# Patient Record
Sex: Male | Born: 1960
Health system: Southern US, Community
[De-identification: ages and names within clinical notes are randomized; demographics above are authoritative.]

## PROBLEM LIST (undated history)

## (undated) DIAGNOSIS — R569 Unspecified convulsions: Secondary | ICD-10-CM

## (undated) DIAGNOSIS — I10 Essential (primary) hypertension: Secondary | ICD-10-CM

## (undated) DIAGNOSIS — F419 Anxiety disorder, unspecified: Secondary | ICD-10-CM

## (undated) DIAGNOSIS — K759 Inflammatory liver disease, unspecified: Secondary | ICD-10-CM

## (undated) HISTORY — PX: KNEE SURGERY: SHX244

## (undated) HISTORY — PX: HERNIA REPAIR: SHX51

## (undated) HISTORY — PX: SHOULDER ARTHROSCOPY: SHX128

## (undated) HISTORY — PX: BACK SURGERY: SHX140

## (undated) HISTORY — PX: ANKLE SURGERY: SHX546

---

## 2012-01-01 ENCOUNTER — Ambulatory Visit: Payer: Self-pay | Admitting: Physician Assistant

## 2014-01-15 DIAGNOSIS — I4891 Unspecified atrial fibrillation: Secondary | ICD-10-CM | POA: Diagnosis not present

## 2014-01-15 DIAGNOSIS — M549 Dorsalgia, unspecified: Secondary | ICD-10-CM | POA: Diagnosis not present

## 2014-01-15 DIAGNOSIS — R55 Syncope and collapse: Secondary | ICD-10-CM | POA: Diagnosis not present

## 2014-01-15 DIAGNOSIS — F17200 Nicotine dependence, unspecified, uncomplicated: Secondary | ICD-10-CM | POA: Diagnosis not present

## 2014-05-02 DIAGNOSIS — M4802 Spinal stenosis, cervical region: Secondary | ICD-10-CM | POA: Diagnosis not present

## 2014-05-02 DIAGNOSIS — M5412 Radiculopathy, cervical region: Secondary | ICD-10-CM | POA: Diagnosis not present

## 2014-05-02 DIAGNOSIS — M961 Postlaminectomy syndrome, not elsewhere classified: Secondary | ICD-10-CM | POA: Diagnosis not present

## 2014-07-09 DIAGNOSIS — Z981 Arthrodesis status: Secondary | ICD-10-CM | POA: Diagnosis not present

## 2014-07-09 DIAGNOSIS — M5412 Radiculopathy, cervical region: Secondary | ICD-10-CM | POA: Diagnosis not present

## 2014-07-09 DIAGNOSIS — M4802 Spinal stenosis, cervical region: Secondary | ICD-10-CM | POA: Diagnosis not present

## 2014-07-09 DIAGNOSIS — M5012 Cervical disc disorder with radiculopathy, mid-cervical region: Secondary | ICD-10-CM | POA: Diagnosis not present

## 2014-07-09 DIAGNOSIS — R200 Anesthesia of skin: Secondary | ICD-10-CM | POA: Diagnosis not present

## 2014-07-09 DIAGNOSIS — M961 Postlaminectomy syndrome, not elsewhere classified: Secondary | ICD-10-CM | POA: Diagnosis not present

## 2014-07-11 DIAGNOSIS — M5412 Radiculopathy, cervical region: Secondary | ICD-10-CM | POA: Diagnosis not present

## 2014-07-11 DIAGNOSIS — M961 Postlaminectomy syndrome, not elsewhere classified: Secondary | ICD-10-CM | POA: Diagnosis not present

## 2014-07-16 DIAGNOSIS — M4802 Spinal stenosis, cervical region: Secondary | ICD-10-CM | POA: Diagnosis not present

## 2014-07-19 DIAGNOSIS — F17210 Nicotine dependence, cigarettes, uncomplicated: Secondary | ICD-10-CM | POA: Diagnosis not present

## 2014-07-19 DIAGNOSIS — M79601 Pain in right arm: Secondary | ICD-10-CM | POA: Diagnosis not present

## 2014-07-19 DIAGNOSIS — M4312 Spondylolisthesis, cervical region: Secondary | ICD-10-CM | POA: Diagnosis not present

## 2014-07-19 DIAGNOSIS — G40909 Epilepsy, unspecified, not intractable, without status epilepticus: Secondary | ICD-10-CM | POA: Diagnosis not present

## 2014-07-19 DIAGNOSIS — M2578 Osteophyte, vertebrae: Secondary | ICD-10-CM | POA: Diagnosis not present

## 2014-07-19 DIAGNOSIS — G8929 Other chronic pain: Secondary | ICD-10-CM | POA: Diagnosis not present

## 2014-07-19 DIAGNOSIS — M542 Cervicalgia: Secondary | ICD-10-CM | POA: Diagnosis not present

## 2014-07-19 DIAGNOSIS — R200 Anesthesia of skin: Secondary | ICD-10-CM | POA: Diagnosis not present

## 2014-07-19 DIAGNOSIS — R51 Headache: Secondary | ICD-10-CM | POA: Diagnosis not present

## 2014-07-19 DIAGNOSIS — M1990 Unspecified osteoarthritis, unspecified site: Secondary | ICD-10-CM | POA: Diagnosis not present

## 2014-07-19 DIAGNOSIS — M9971 Connective tissue and disc stenosis of intervertebral foramina of cervical region: Secondary | ICD-10-CM | POA: Diagnosis not present

## 2014-07-29 DIAGNOSIS — M5412 Radiculopathy, cervical region: Secondary | ICD-10-CM | POA: Diagnosis not present

## 2014-07-29 DIAGNOSIS — M4802 Spinal stenosis, cervical region: Secondary | ICD-10-CM | POA: Diagnosis not present

## 2014-07-29 DIAGNOSIS — M961 Postlaminectomy syndrome, not elsewhere classified: Secondary | ICD-10-CM | POA: Diagnosis not present

## 2014-08-14 DIAGNOSIS — G959 Disease of spinal cord, unspecified: Secondary | ICD-10-CM | POA: Diagnosis not present

## 2014-08-19 DIAGNOSIS — G253 Myoclonus: Secondary | ICD-10-CM | POA: Diagnosis not present

## 2014-08-19 DIAGNOSIS — M4802 Spinal stenosis, cervical region: Secondary | ICD-10-CM | POA: Diagnosis not present

## 2014-10-25 DIAGNOSIS — G959 Disease of spinal cord, unspecified: Secondary | ICD-10-CM | POA: Diagnosis not present

## 2014-10-25 DIAGNOSIS — Z01818 Encounter for other preprocedural examination: Secondary | ICD-10-CM | POA: Diagnosis not present

## 2014-11-01 DIAGNOSIS — M2578 Osteophyte, vertebrae: Secondary | ICD-10-CM | POA: Diagnosis not present

## 2014-11-01 DIAGNOSIS — G8929 Other chronic pain: Secondary | ICD-10-CM | POA: Diagnosis not present

## 2014-11-01 DIAGNOSIS — M79661 Pain in right lower leg: Secondary | ICD-10-CM | POA: Diagnosis not present

## 2014-11-01 DIAGNOSIS — F17210 Nicotine dependence, cigarettes, uncomplicated: Secondary | ICD-10-CM | POA: Diagnosis not present

## 2014-11-01 DIAGNOSIS — M47819 Spondylosis without myelopathy or radiculopathy, site unspecified: Secondary | ICD-10-CM | POA: Diagnosis not present

## 2014-11-01 DIAGNOSIS — M47816 Spondylosis without myelopathy or radiculopathy, lumbar region: Secondary | ICD-10-CM | POA: Diagnosis not present

## 2014-11-01 DIAGNOSIS — M5431 Sciatica, right side: Secondary | ICD-10-CM | POA: Diagnosis not present

## 2014-11-01 DIAGNOSIS — M4802 Spinal stenosis, cervical region: Secondary | ICD-10-CM | POA: Diagnosis not present

## 2014-11-01 DIAGNOSIS — M25551 Pain in right hip: Secondary | ICD-10-CM | POA: Diagnosis not present

## 2014-11-01 DIAGNOSIS — G40909 Epilepsy, unspecified, not intractable, without status epilepticus: Secondary | ICD-10-CM | POA: Diagnosis not present

## 2014-11-01 DIAGNOSIS — I4891 Unspecified atrial fibrillation: Secondary | ICD-10-CM | POA: Diagnosis not present

## 2014-11-01 DIAGNOSIS — M1611 Unilateral primary osteoarthritis, right hip: Secondary | ICD-10-CM | POA: Diagnosis not present

## 2014-11-01 DIAGNOSIS — M5441 Lumbago with sciatica, right side: Secondary | ICD-10-CM | POA: Diagnosis not present

## 2014-11-04 DIAGNOSIS — K4090 Unilateral inguinal hernia, without obstruction or gangrene, not specified as recurrent: Secondary | ICD-10-CM | POA: Diagnosis present

## 2014-11-04 DIAGNOSIS — F40240 Claustrophobia: Secondary | ICD-10-CM | POA: Diagnosis present

## 2014-11-04 DIAGNOSIS — G253 Myoclonus: Secondary | ICD-10-CM | POA: Diagnosis not present

## 2014-11-04 DIAGNOSIS — F419 Anxiety disorder, unspecified: Secondary | ICD-10-CM | POA: Diagnosis present

## 2014-11-04 DIAGNOSIS — M961 Postlaminectomy syndrome, not elsewhere classified: Secondary | ICD-10-CM | POA: Diagnosis present

## 2014-11-04 DIAGNOSIS — R561 Post traumatic seizures: Secondary | ICD-10-CM | POA: Diagnosis present

## 2014-11-04 DIAGNOSIS — M5412 Radiculopathy, cervical region: Secondary | ICD-10-CM | POA: Diagnosis not present

## 2015-01-08 DIAGNOSIS — G959 Disease of spinal cord, unspecified: Secondary | ICD-10-CM | POA: Diagnosis not present

## 2015-01-14 DIAGNOSIS — Z6827 Body mass index (BMI) 27.0-27.9, adult: Secondary | ICD-10-CM | POA: Diagnosis not present

## 2015-01-14 DIAGNOSIS — J019 Acute sinusitis, unspecified: Secondary | ICD-10-CM | POA: Diagnosis not present

## 2015-03-12 DIAGNOSIS — M25559 Pain in unspecified hip: Secondary | ICD-10-CM | POA: Diagnosis not present

## 2015-03-12 DIAGNOSIS — G8929 Other chronic pain: Secondary | ICD-10-CM | POA: Diagnosis not present

## 2015-04-09 DIAGNOSIS — M5412 Radiculopathy, cervical region: Secondary | ICD-10-CM | POA: Diagnosis not present

## 2015-05-30 DIAGNOSIS — J342 Deviated nasal septum: Secondary | ICD-10-CM | POA: Diagnosis not present

## 2015-05-30 DIAGNOSIS — R0981 Nasal congestion: Secondary | ICD-10-CM | POA: Diagnosis not present

## 2015-05-30 DIAGNOSIS — J329 Chronic sinusitis, unspecified: Secondary | ICD-10-CM | POA: Diagnosis not present

## 2015-06-02 DIAGNOSIS — Z23 Encounter for immunization: Secondary | ICD-10-CM | POA: Diagnosis not present

## 2015-06-10 DIAGNOSIS — J322 Chronic ethmoidal sinusitis: Secondary | ICD-10-CM | POA: Diagnosis not present

## 2015-06-10 DIAGNOSIS — R51 Headache: Secondary | ICD-10-CM | POA: Diagnosis not present

## 2015-06-10 DIAGNOSIS — J329 Chronic sinusitis, unspecified: Secondary | ICD-10-CM | POA: Diagnosis not present

## 2015-07-04 DIAGNOSIS — J320 Chronic maxillary sinusitis: Secondary | ICD-10-CM | POA: Diagnosis not present

## 2015-07-04 DIAGNOSIS — J322 Chronic ethmoidal sinusitis: Secondary | ICD-10-CM | POA: Diagnosis not present

## 2015-07-04 DIAGNOSIS — J329 Chronic sinusitis, unspecified: Secondary | ICD-10-CM | POA: Diagnosis not present

## 2015-07-04 DIAGNOSIS — R0981 Nasal congestion: Secondary | ICD-10-CM | POA: Diagnosis not present

## 2015-07-04 DIAGNOSIS — J342 Deviated nasal septum: Secondary | ICD-10-CM | POA: Diagnosis not present

## 2015-07-04 DIAGNOSIS — J321 Chronic frontal sinusitis: Secondary | ICD-10-CM | POA: Diagnosis not present

## 2015-07-04 DIAGNOSIS — J328 Other chronic sinusitis: Secondary | ICD-10-CM | POA: Diagnosis not present

## 2015-07-17 DIAGNOSIS — G629 Polyneuropathy, unspecified: Secondary | ICD-10-CM | POA: Diagnosis not present

## 2015-07-17 DIAGNOSIS — Z79899 Other long term (current) drug therapy: Secondary | ICD-10-CM | POA: Diagnosis not present

## 2015-07-17 DIAGNOSIS — M961 Postlaminectomy syndrome, not elsewhere classified: Secondary | ICD-10-CM | POA: Diagnosis not present

## 2015-08-13 DIAGNOSIS — M47812 Spondylosis without myelopathy or radiculopathy, cervical region: Secondary | ICD-10-CM | POA: Diagnosis not present

## 2015-08-13 DIAGNOSIS — M5412 Radiculopathy, cervical region: Secondary | ICD-10-CM | POA: Diagnosis not present

## 2015-08-18 DIAGNOSIS — M25552 Pain in left hip: Secondary | ICD-10-CM | POA: Diagnosis not present

## 2015-08-18 DIAGNOSIS — G8929 Other chronic pain: Secondary | ICD-10-CM | POA: Diagnosis not present

## 2015-08-18 DIAGNOSIS — M25551 Pain in right hip: Secondary | ICD-10-CM | POA: Diagnosis not present

## 2015-08-18 DIAGNOSIS — Z79899 Other long term (current) drug therapy: Secondary | ICD-10-CM | POA: Diagnosis not present

## 2015-08-18 DIAGNOSIS — M25559 Pain in unspecified hip: Secondary | ICD-10-CM | POA: Diagnosis not present

## 2015-08-19 DIAGNOSIS — J343 Hypertrophy of nasal turbinates: Secondary | ICD-10-CM | POA: Diagnosis not present

## 2015-08-19 DIAGNOSIS — J322 Chronic ethmoidal sinusitis: Secondary | ICD-10-CM | POA: Diagnosis not present

## 2015-08-19 DIAGNOSIS — J321 Chronic frontal sinusitis: Secondary | ICD-10-CM | POA: Diagnosis not present

## 2015-08-19 DIAGNOSIS — J320 Chronic maxillary sinusitis: Secondary | ICD-10-CM | POA: Diagnosis not present

## 2015-08-19 DIAGNOSIS — J329 Chronic sinusitis, unspecified: Secondary | ICD-10-CM | POA: Diagnosis not present

## 2015-08-19 DIAGNOSIS — R0981 Nasal congestion: Secondary | ICD-10-CM | POA: Diagnosis not present

## 2015-08-19 DIAGNOSIS — J342 Deviated nasal septum: Secondary | ICD-10-CM | POA: Diagnosis not present

## 2015-08-26 DIAGNOSIS — Z6825 Body mass index (BMI) 25.0-25.9, adult: Secondary | ICD-10-CM | POA: Diagnosis not present

## 2015-08-26 DIAGNOSIS — J329 Chronic sinusitis, unspecified: Secondary | ICD-10-CM | POA: Diagnosis not present

## 2015-08-26 DIAGNOSIS — J343 Hypertrophy of nasal turbinates: Secondary | ICD-10-CM | POA: Diagnosis not present

## 2015-08-26 DIAGNOSIS — Z79899 Other long term (current) drug therapy: Secondary | ICD-10-CM | POA: Diagnosis not present

## 2015-08-26 DIAGNOSIS — R748 Abnormal levels of other serum enzymes: Secondary | ICD-10-CM | POA: Diagnosis not present

## 2015-08-26 DIAGNOSIS — Z1389 Encounter for screening for other disorder: Secondary | ICD-10-CM | POA: Diagnosis not present

## 2015-08-26 DIAGNOSIS — Z8679 Personal history of other diseases of the circulatory system: Secondary | ICD-10-CM | POA: Diagnosis not present

## 2015-09-10 DIAGNOSIS — R1011 Right upper quadrant pain: Secondary | ICD-10-CM | POA: Diagnosis not present

## 2015-09-10 DIAGNOSIS — R17 Unspecified jaundice: Secondary | ICD-10-CM | POA: Diagnosis not present

## 2015-09-30 DIAGNOSIS — F17200 Nicotine dependence, unspecified, uncomplicated: Secondary | ICD-10-CM | POA: Diagnosis not present

## 2015-09-30 DIAGNOSIS — J329 Chronic sinusitis, unspecified: Secondary | ICD-10-CM | POA: Diagnosis not present

## 2015-09-30 DIAGNOSIS — R748 Abnormal levels of other serum enzymes: Secondary | ICD-10-CM | POA: Diagnosis not present

## 2015-09-30 DIAGNOSIS — R1013 Epigastric pain: Secondary | ICD-10-CM | POA: Diagnosis not present

## 2015-09-30 DIAGNOSIS — Z1389 Encounter for screening for other disorder: Secondary | ICD-10-CM | POA: Diagnosis not present

## 2015-09-30 DIAGNOSIS — Z720 Tobacco use: Secondary | ICD-10-CM | POA: Diagnosis not present

## 2015-10-02 DIAGNOSIS — B182 Chronic viral hepatitis C: Secondary | ICD-10-CM | POA: Diagnosis not present

## 2015-10-02 DIAGNOSIS — Z6826 Body mass index (BMI) 26.0-26.9, adult: Secondary | ICD-10-CM | POA: Diagnosis not present

## 2016-02-03 DIAGNOSIS — H547 Unspecified visual loss: Secondary | ICD-10-CM | POA: Diagnosis not present

## 2016-02-03 DIAGNOSIS — J069 Acute upper respiratory infection, unspecified: Secondary | ICD-10-CM | POA: Diagnosis not present

## 2016-02-03 DIAGNOSIS — B182 Chronic viral hepatitis C: Secondary | ICD-10-CM | POA: Diagnosis not present

## 2016-02-03 DIAGNOSIS — K5903 Drug induced constipation: Secondary | ICD-10-CM | POA: Diagnosis not present

## 2016-02-16 DIAGNOSIS — M5412 Radiculopathy, cervical region: Secondary | ICD-10-CM | POA: Diagnosis not present

## 2016-02-16 DIAGNOSIS — M961 Postlaminectomy syndrome, not elsewhere classified: Secondary | ICD-10-CM | POA: Diagnosis not present

## 2016-02-19 DIAGNOSIS — M25551 Pain in right hip: Secondary | ICD-10-CM | POA: Diagnosis not present

## 2016-02-19 DIAGNOSIS — G8929 Other chronic pain: Secondary | ICD-10-CM | POA: Diagnosis not present

## 2016-02-19 DIAGNOSIS — M4802 Spinal stenosis, cervical region: Secondary | ICD-10-CM | POA: Diagnosis not present

## 2016-02-19 DIAGNOSIS — M5412 Radiculopathy, cervical region: Secondary | ICD-10-CM | POA: Diagnosis not present

## 2016-02-19 DIAGNOSIS — Z79899 Other long term (current) drug therapy: Secondary | ICD-10-CM | POA: Diagnosis not present

## 2016-02-19 DIAGNOSIS — F17210 Nicotine dependence, cigarettes, uncomplicated: Secondary | ICD-10-CM | POA: Diagnosis not present

## 2016-02-19 DIAGNOSIS — M961 Postlaminectomy syndrome, not elsewhere classified: Secondary | ICD-10-CM | POA: Diagnosis not present

## 2016-02-19 DIAGNOSIS — G629 Polyneuropathy, unspecified: Secondary | ICD-10-CM | POA: Diagnosis not present

## 2016-02-19 DIAGNOSIS — M25552 Pain in left hip: Secondary | ICD-10-CM | POA: Diagnosis not present

## 2016-03-09 DIAGNOSIS — G8929 Other chronic pain: Secondary | ICD-10-CM | POA: Diagnosis not present

## 2016-03-09 DIAGNOSIS — M25551 Pain in right hip: Secondary | ICD-10-CM | POA: Diagnosis not present

## 2016-03-09 DIAGNOSIS — M25552 Pain in left hip: Secondary | ICD-10-CM | POA: Diagnosis not present

## 2016-05-05 DIAGNOSIS — J209 Acute bronchitis, unspecified: Secondary | ICD-10-CM | POA: Diagnosis not present

## 2016-05-05 DIAGNOSIS — J329 Chronic sinusitis, unspecified: Secondary | ICD-10-CM | POA: Diagnosis not present

## 2016-05-05 DIAGNOSIS — N529 Male erectile dysfunction, unspecified: Secondary | ICD-10-CM | POA: Diagnosis not present

## 2016-05-27 DIAGNOSIS — M25551 Pain in right hip: Secondary | ICD-10-CM | POA: Diagnosis not present

## 2016-05-27 DIAGNOSIS — G8929 Other chronic pain: Secondary | ICD-10-CM | POA: Diagnosis not present

## 2016-05-27 DIAGNOSIS — M25552 Pain in left hip: Secondary | ICD-10-CM | POA: Diagnosis not present

## 2016-05-27 DIAGNOSIS — M5412 Radiculopathy, cervical region: Secondary | ICD-10-CM | POA: Diagnosis not present

## 2016-08-17 DIAGNOSIS — I4891 Unspecified atrial fibrillation: Secondary | ICD-10-CM | POA: Diagnosis not present

## 2016-08-17 DIAGNOSIS — B182 Chronic viral hepatitis C: Secondary | ICD-10-CM | POA: Diagnosis not present

## 2016-08-17 DIAGNOSIS — R768 Other specified abnormal immunological findings in serum: Secondary | ICD-10-CM | POA: Diagnosis not present

## 2016-08-17 DIAGNOSIS — F17210 Nicotine dependence, cigarettes, uncomplicated: Secondary | ICD-10-CM | POA: Diagnosis not present

## 2016-08-17 DIAGNOSIS — Z1159 Encounter for screening for other viral diseases: Secondary | ICD-10-CM | POA: Diagnosis not present

## 2016-08-19 DIAGNOSIS — M25552 Pain in left hip: Secondary | ICD-10-CM | POA: Diagnosis not present

## 2016-08-19 DIAGNOSIS — M25551 Pain in right hip: Secondary | ICD-10-CM | POA: Diagnosis not present

## 2016-09-01 DIAGNOSIS — M5412 Radiculopathy, cervical region: Secondary | ICD-10-CM | POA: Diagnosis not present

## 2016-09-15 DIAGNOSIS — M542 Cervicalgia: Secondary | ICD-10-CM | POA: Diagnosis not present

## 2016-09-15 DIAGNOSIS — M5412 Radiculopathy, cervical region: Secondary | ICD-10-CM | POA: Diagnosis not present

## 2016-09-28 DIAGNOSIS — M542 Cervicalgia: Secondary | ICD-10-CM | POA: Diagnosis not present

## 2016-09-28 DIAGNOSIS — M5412 Radiculopathy, cervical region: Secondary | ICD-10-CM | POA: Diagnosis not present

## 2016-09-28 DIAGNOSIS — M4722 Other spondylosis with radiculopathy, cervical region: Secondary | ICD-10-CM | POA: Diagnosis not present

## 2016-12-21 DIAGNOSIS — B182 Chronic viral hepatitis C: Secondary | ICD-10-CM | POA: Diagnosis not present

## 2016-12-21 DIAGNOSIS — R1084 Generalized abdominal pain: Secondary | ICD-10-CM | POA: Diagnosis not present

## 2016-12-28 DIAGNOSIS — G8929 Other chronic pain: Secondary | ICD-10-CM | POA: Diagnosis not present

## 2016-12-28 DIAGNOSIS — M25552 Pain in left hip: Secondary | ICD-10-CM | POA: Diagnosis not present

## 2016-12-28 DIAGNOSIS — M25551 Pain in right hip: Secondary | ICD-10-CM | POA: Diagnosis not present

## 2017-01-07 DIAGNOSIS — M5412 Radiculopathy, cervical region: Secondary | ICD-10-CM | POA: Diagnosis not present

## 2017-02-03 DIAGNOSIS — R634 Abnormal weight loss: Secondary | ICD-10-CM | POA: Diagnosis not present

## 2017-02-03 DIAGNOSIS — Z720 Tobacco use: Secondary | ICD-10-CM | POA: Diagnosis not present

## 2017-02-03 DIAGNOSIS — Z1389 Encounter for screening for other disorder: Secondary | ICD-10-CM | POA: Diagnosis not present

## 2017-02-03 DIAGNOSIS — Z9181 History of falling: Secondary | ICD-10-CM | POA: Diagnosis not present

## 2017-02-03 DIAGNOSIS — R1084 Generalized abdominal pain: Secondary | ICD-10-CM | POA: Diagnosis not present

## 2017-02-03 DIAGNOSIS — R05 Cough: Secondary | ICD-10-CM | POA: Diagnosis not present

## 2017-02-03 DIAGNOSIS — M542 Cervicalgia: Secondary | ICD-10-CM | POA: Diagnosis not present

## 2017-02-03 DIAGNOSIS — B182 Chronic viral hepatitis C: Secondary | ICD-10-CM | POA: Diagnosis not present

## 2017-02-10 ENCOUNTER — Encounter: Payer: Self-pay | Admitting: Physician Assistant

## 2017-02-10 DIAGNOSIS — R1110 Vomiting, unspecified: Secondary | ICD-10-CM | POA: Diagnosis not present

## 2017-02-10 DIAGNOSIS — G8929 Other chronic pain: Secondary | ICD-10-CM | POA: Diagnosis not present

## 2017-02-10 DIAGNOSIS — R109 Unspecified abdominal pain: Secondary | ICD-10-CM | POA: Diagnosis not present

## 2017-02-10 DIAGNOSIS — I4891 Unspecified atrial fibrillation: Secondary | ICD-10-CM | POA: Diagnosis not present

## 2017-02-10 DIAGNOSIS — F17210 Nicotine dependence, cigarettes, uncomplicated: Secondary | ICD-10-CM | POA: Diagnosis not present

## 2017-02-15 DIAGNOSIS — R079 Chest pain, unspecified: Secondary | ICD-10-CM | POA: Diagnosis not present

## 2017-02-15 DIAGNOSIS — R05 Cough: Secondary | ICD-10-CM | POA: Diagnosis not present

## 2017-02-15 DIAGNOSIS — R634 Abnormal weight loss: Secondary | ICD-10-CM | POA: Diagnosis not present

## 2017-02-15 DIAGNOSIS — R0602 Shortness of breath: Secondary | ICD-10-CM | POA: Diagnosis not present

## 2017-02-23 DIAGNOSIS — R001 Bradycardia, unspecified: Secondary | ICD-10-CM | POA: Diagnosis not present

## 2017-02-23 DIAGNOSIS — F17200 Nicotine dependence, unspecified, uncomplicated: Secondary | ICD-10-CM | POA: Diagnosis not present

## 2017-02-23 DIAGNOSIS — R079 Chest pain, unspecified: Secondary | ICD-10-CM | POA: Diagnosis not present

## 2017-02-23 DIAGNOSIS — I480 Paroxysmal atrial fibrillation: Secondary | ICD-10-CM | POA: Diagnosis not present

## 2017-02-25 DIAGNOSIS — Z6823 Body mass index (BMI) 23.0-23.9, adult: Secondary | ICD-10-CM | POA: Diagnosis not present

## 2017-02-25 DIAGNOSIS — R109 Unspecified abdominal pain: Secondary | ICD-10-CM | POA: Diagnosis not present

## 2017-02-25 DIAGNOSIS — H579 Unspecified disorder of eye and adnexa: Secondary | ICD-10-CM | POA: Diagnosis not present

## 2017-03-01 DIAGNOSIS — B182 Chronic viral hepatitis C: Secondary | ICD-10-CM | POA: Diagnosis not present

## 2017-03-01 DIAGNOSIS — Z79891 Long term (current) use of opiate analgesic: Secondary | ICD-10-CM | POA: Diagnosis not present

## 2017-03-07 DIAGNOSIS — R079 Chest pain, unspecified: Secondary | ICD-10-CM | POA: Diagnosis not present

## 2017-03-14 DIAGNOSIS — R079 Chest pain, unspecified: Secondary | ICD-10-CM | POA: Diagnosis not present

## 2017-03-14 DIAGNOSIS — I480 Paroxysmal atrial fibrillation: Secondary | ICD-10-CM | POA: Diagnosis not present

## 2017-03-14 DIAGNOSIS — F17200 Nicotine dependence, unspecified, uncomplicated: Secondary | ICD-10-CM | POA: Diagnosis not present

## 2017-03-29 DIAGNOSIS — M25551 Pain in right hip: Secondary | ICD-10-CM | POA: Diagnosis not present

## 2017-03-29 DIAGNOSIS — G8929 Other chronic pain: Secondary | ICD-10-CM | POA: Diagnosis not present

## 2017-03-29 DIAGNOSIS — M25552 Pain in left hip: Secondary | ICD-10-CM | POA: Diagnosis not present

## 2017-04-04 DIAGNOSIS — Z5181 Encounter for therapeutic drug level monitoring: Secondary | ICD-10-CM | POA: Diagnosis not present

## 2017-04-04 DIAGNOSIS — R1084 Generalized abdominal pain: Secondary | ICD-10-CM | POA: Diagnosis not present

## 2017-04-04 DIAGNOSIS — G8929 Other chronic pain: Secondary | ICD-10-CM | POA: Diagnosis not present

## 2017-04-04 DIAGNOSIS — M25552 Pain in left hip: Secondary | ICD-10-CM | POA: Diagnosis not present

## 2017-04-04 DIAGNOSIS — K5909 Other constipation: Secondary | ICD-10-CM | POA: Diagnosis not present

## 2017-04-04 DIAGNOSIS — M25551 Pain in right hip: Secondary | ICD-10-CM | POA: Diagnosis not present

## 2017-04-04 DIAGNOSIS — R194 Change in bowel habit: Secondary | ICD-10-CM | POA: Diagnosis not present

## 2017-04-04 DIAGNOSIS — M961 Postlaminectomy syndrome, not elsewhere classified: Secondary | ICD-10-CM | POA: Diagnosis not present

## 2017-04-04 DIAGNOSIS — M5412 Radiculopathy, cervical region: Secondary | ICD-10-CM | POA: Diagnosis not present

## 2017-04-22 DIAGNOSIS — G894 Chronic pain syndrome: Secondary | ICD-10-CM | POA: Diagnosis not present

## 2017-04-26 DIAGNOSIS — R634 Abnormal weight loss: Secondary | ICD-10-CM | POA: Diagnosis not present

## 2017-04-26 DIAGNOSIS — B182 Chronic viral hepatitis C: Secondary | ICD-10-CM | POA: Diagnosis not present

## 2017-06-21 DIAGNOSIS — G8929 Other chronic pain: Secondary | ICD-10-CM | POA: Diagnosis not present

## 2017-06-21 DIAGNOSIS — M25552 Pain in left hip: Secondary | ICD-10-CM | POA: Diagnosis not present

## 2017-06-21 DIAGNOSIS — F17210 Nicotine dependence, cigarettes, uncomplicated: Secondary | ICD-10-CM | POA: Diagnosis not present

## 2017-06-21 DIAGNOSIS — B182 Chronic viral hepatitis C: Secondary | ICD-10-CM | POA: Diagnosis not present

## 2017-06-21 DIAGNOSIS — R1084 Generalized abdominal pain: Secondary | ICD-10-CM | POA: Diagnosis not present

## 2017-06-21 DIAGNOSIS — R194 Change in bowel habit: Secondary | ICD-10-CM | POA: Diagnosis not present

## 2017-06-21 DIAGNOSIS — K621 Rectal polyp: Secondary | ICD-10-CM | POA: Diagnosis not present

## 2017-06-21 DIAGNOSIS — Z79899 Other long term (current) drug therapy: Secondary | ICD-10-CM | POA: Diagnosis not present

## 2017-06-21 DIAGNOSIS — M25551 Pain in right hip: Secondary | ICD-10-CM | POA: Diagnosis not present

## 2017-06-21 DIAGNOSIS — I480 Paroxysmal atrial fibrillation: Secondary | ICD-10-CM | POA: Diagnosis not present

## 2017-06-22 DIAGNOSIS — F4321 Adjustment disorder with depressed mood: Secondary | ICD-10-CM | POA: Diagnosis not present

## 2017-06-28 DIAGNOSIS — G8929 Other chronic pain: Secondary | ICD-10-CM | POA: Diagnosis not present

## 2017-06-28 DIAGNOSIS — M25552 Pain in left hip: Secondary | ICD-10-CM | POA: Diagnosis not present

## 2017-06-28 DIAGNOSIS — M25551 Pain in right hip: Secondary | ICD-10-CM | POA: Diagnosis not present

## 2017-07-05 DIAGNOSIS — S22059A Unspecified fracture of T5-T6 vertebra, initial encounter for closed fracture: Secondary | ICD-10-CM | POA: Diagnosis not present

## 2017-07-05 DIAGNOSIS — M25512 Pain in left shoulder: Secondary | ICD-10-CM | POA: Diagnosis not present

## 2017-07-05 DIAGNOSIS — G894 Chronic pain syndrome: Secondary | ICD-10-CM | POA: Diagnosis not present

## 2017-07-05 DIAGNOSIS — I77811 Abdominal aortic ectasia: Secondary | ICD-10-CM | POA: Diagnosis not present

## 2017-07-05 DIAGNOSIS — M546 Pain in thoracic spine: Secondary | ICD-10-CM | POA: Diagnosis not present

## 2017-07-05 DIAGNOSIS — J942 Hemothorax: Secondary | ICD-10-CM | POA: Diagnosis not present

## 2017-07-05 DIAGNOSIS — S270XXA Traumatic pneumothorax, initial encounter: Secondary | ICD-10-CM | POA: Diagnosis not present

## 2017-07-05 DIAGNOSIS — S272XXA Traumatic hemopneumothorax, initial encounter: Secondary | ICD-10-CM | POA: Diagnosis not present

## 2017-07-05 DIAGNOSIS — R109 Unspecified abdominal pain: Secondary | ICD-10-CM | POA: Diagnosis not present

## 2017-07-05 DIAGNOSIS — K769 Liver disease, unspecified: Secondary | ICD-10-CM | POA: Diagnosis not present

## 2017-07-05 DIAGNOSIS — S2243XA Multiple fractures of ribs, bilateral, initial encounter for closed fracture: Secondary | ICD-10-CM | POA: Diagnosis not present

## 2017-07-05 DIAGNOSIS — I700 Atherosclerosis of aorta: Secondary | ICD-10-CM | POA: Diagnosis not present

## 2017-07-05 DIAGNOSIS — F17210 Nicotine dependence, cigarettes, uncomplicated: Secondary | ICD-10-CM | POA: Diagnosis not present

## 2017-07-05 DIAGNOSIS — M549 Dorsalgia, unspecified: Secondary | ICD-10-CM | POA: Diagnosis not present

## 2017-07-05 DIAGNOSIS — S3991XA Unspecified injury of abdomen, initial encounter: Secondary | ICD-10-CM | POA: Diagnosis not present

## 2017-07-05 DIAGNOSIS — Z043 Encounter for examination and observation following other accident: Secondary | ICD-10-CM | POA: Diagnosis not present

## 2017-07-05 DIAGNOSIS — S3992XA Unspecified injury of lower back, initial encounter: Secondary | ICD-10-CM | POA: Diagnosis not present

## 2017-07-05 DIAGNOSIS — Y33XXXA Other specified events, undetermined intent, initial encounter: Secondary | ICD-10-CM | POA: Diagnosis not present

## 2017-07-05 DIAGNOSIS — S22049A Unspecified fracture of fourth thoracic vertebra, initial encounter for closed fracture: Secondary | ICD-10-CM | POA: Diagnosis not present

## 2017-07-05 DIAGNOSIS — G8929 Other chronic pain: Secondary | ICD-10-CM | POA: Diagnosis not present

## 2017-07-05 DIAGNOSIS — S199XXA Unspecified injury of neck, initial encounter: Secondary | ICD-10-CM | POA: Diagnosis not present

## 2017-07-05 DIAGNOSIS — S24109A Unspecified injury at unspecified level of thoracic spinal cord, initial encounter: Secondary | ICD-10-CM | POA: Diagnosis not present

## 2017-07-05 DIAGNOSIS — J939 Pneumothorax, unspecified: Secondary | ICD-10-CM | POA: Diagnosis not present

## 2017-07-05 DIAGNOSIS — M542 Cervicalgia: Secondary | ICD-10-CM | POA: Diagnosis not present

## 2017-07-05 DIAGNOSIS — W1789XA Other fall from one level to another, initial encounter: Secondary | ICD-10-CM | POA: Diagnosis not present

## 2017-07-05 DIAGNOSIS — R079 Chest pain, unspecified: Secondary | ICD-10-CM | POA: Diagnosis not present

## 2017-07-05 DIAGNOSIS — W19XXXA Unspecified fall, initial encounter: Secondary | ICD-10-CM | POA: Diagnosis not present

## 2017-07-05 DIAGNOSIS — G8911 Acute pain due to trauma: Secondary | ICD-10-CM | POA: Diagnosis not present

## 2017-07-05 DIAGNOSIS — I4891 Unspecified atrial fibrillation: Secondary | ICD-10-CM | POA: Diagnosis not present

## 2017-07-05 DIAGNOSIS — J948 Other specified pleural conditions: Secondary | ICD-10-CM | POA: Diagnosis not present

## 2017-07-05 DIAGNOSIS — S27309A Unspecified injury of lung, unspecified, initial encounter: Secondary | ICD-10-CM | POA: Diagnosis not present

## 2017-07-05 DIAGNOSIS — S2242XA Multiple fractures of ribs, left side, initial encounter for closed fracture: Secondary | ICD-10-CM | POA: Diagnosis not present

## 2017-07-06 DIAGNOSIS — G8911 Acute pain due to trauma: Secondary | ICD-10-CM | POA: Diagnosis not present

## 2017-07-06 DIAGNOSIS — J942 Hemothorax: Secondary | ICD-10-CM | POA: Diagnosis not present

## 2017-07-06 DIAGNOSIS — J948 Other specified pleural conditions: Secondary | ICD-10-CM | POA: Diagnosis not present

## 2017-07-07 DIAGNOSIS — J948 Other specified pleural conditions: Secondary | ICD-10-CM | POA: Diagnosis not present

## 2017-07-07 DIAGNOSIS — G8911 Acute pain due to trauma: Secondary | ICD-10-CM | POA: Diagnosis not present

## 2017-07-07 DIAGNOSIS — J942 Hemothorax: Secondary | ICD-10-CM | POA: Diagnosis not present

## 2017-07-11 DIAGNOSIS — M25552 Pain in left hip: Secondary | ICD-10-CM | POA: Diagnosis not present

## 2017-07-11 DIAGNOSIS — Z79891 Long term (current) use of opiate analgesic: Secondary | ICD-10-CM | POA: Diagnosis not present

## 2017-07-11 DIAGNOSIS — M25551 Pain in right hip: Secondary | ICD-10-CM | POA: Diagnosis not present

## 2017-07-11 DIAGNOSIS — M961 Postlaminectomy syndrome, not elsewhere classified: Secondary | ICD-10-CM | POA: Diagnosis not present

## 2017-07-11 DIAGNOSIS — G894 Chronic pain syndrome: Secondary | ICD-10-CM | POA: Diagnosis not present

## 2017-08-11 DIAGNOSIS — M25512 Pain in left shoulder: Secondary | ICD-10-CM | POA: Diagnosis not present

## 2017-08-11 DIAGNOSIS — M25551 Pain in right hip: Secondary | ICD-10-CM | POA: Diagnosis not present

## 2017-08-11 DIAGNOSIS — G894 Chronic pain syndrome: Secondary | ICD-10-CM | POA: Diagnosis not present

## 2017-08-11 DIAGNOSIS — M25511 Pain in right shoulder: Secondary | ICD-10-CM | POA: Diagnosis not present

## 2017-08-11 DIAGNOSIS — M25552 Pain in left hip: Secondary | ICD-10-CM | POA: Diagnosis not present

## 2017-08-11 DIAGNOSIS — Z79891 Long term (current) use of opiate analgesic: Secondary | ICD-10-CM | POA: Diagnosis not present

## 2017-08-11 DIAGNOSIS — M961 Postlaminectomy syndrome, not elsewhere classified: Secondary | ICD-10-CM | POA: Diagnosis not present

## 2017-08-11 DIAGNOSIS — M25531 Pain in right wrist: Secondary | ICD-10-CM | POA: Diagnosis not present

## 2019-02-21 ENCOUNTER — Other Ambulatory Visit: Payer: Self-pay | Admitting: Neurology

## 2019-02-21 DIAGNOSIS — R253 Fasciculation: Secondary | ICD-10-CM

## 2019-03-03 ENCOUNTER — Other Ambulatory Visit: Payer: Self-pay

## 2019-03-03 ENCOUNTER — Ambulatory Visit
Admission: RE | Admit: 2019-03-03 | Discharge: 2019-03-03 | Disposition: A | Discharge status: Home / Self Care | Payer: Medicare Other | Source: Ambulatory Visit | Attending: Neurology | Admitting: Neurology

## 2019-03-03 DIAGNOSIS — R253 Fasciculation: Secondary | ICD-10-CM

## 2019-03-03 LAB — POCT I-STAT CREATININE: Creatinine, Ser: 0.8 mg/dL (ref 0.61–1.24)

## 2019-03-03 MED ORDER — GADOBUTROL 1 MMOL/ML IV SOLN
9.0000 mL | Freq: Once | INTRAVENOUS | Status: AC | PRN
  Administered 2019-03-03: 9 mL via INTRAVENOUS

## 2021-02-03 IMAGING — MR MRI HEAD WITHOUT AND WITH CONTRAST
14 series · 46 of 48 positions shown · IV contrast (Gadavist)
Comparison: CT head 06/10/2015.

CLINICAL DATA: Patient states that after neck surgery in 2446 he
developed tremors and shaking. Current symptoms of RIGHT hand
numbness.

Jerking TVH.G ([id number])
EXAM:
MRI HEAD WITHOUT AND WITH CONTRAST
TECHNIQUE: Multiplanar, multiecho pulse sequences of the brain and surrounding
structures were obtained without and with intravenous contrast.
CONTRAST:  Gadavist 9 mL.

[Series 5: ax dwi_tracew · axial · 3.0mm · 0.60mm/px · z∈[-53,+109]mm · 4 of 55 slices shown]
[im 1/55]
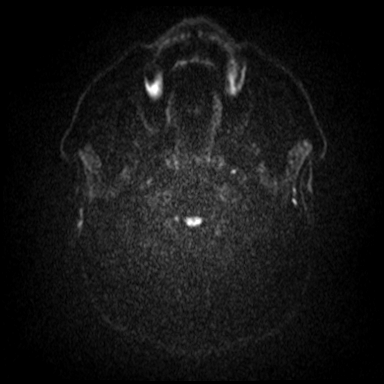
[im 19/55]
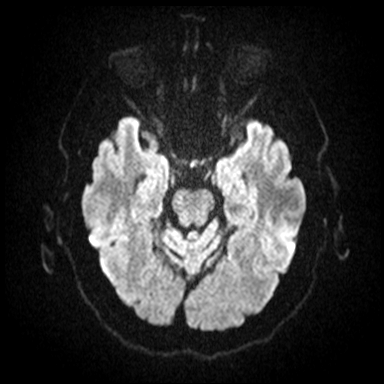
[im 37/55]
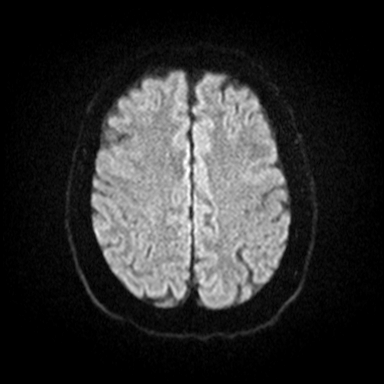
[im 55/55]
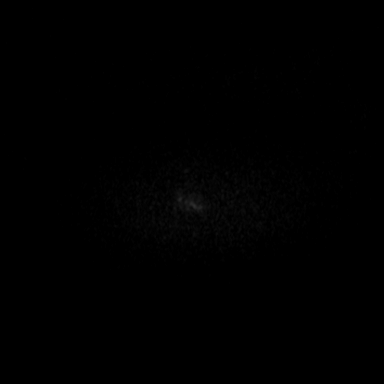

[Series 6: ax dwi_adc · axial · 3.0mm · 0.60mm/px · z∈[-53,+109]mm · 3 of 55 slices shown]
[im 1/55]
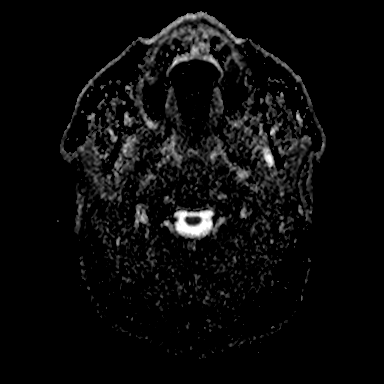
[im 28/55]
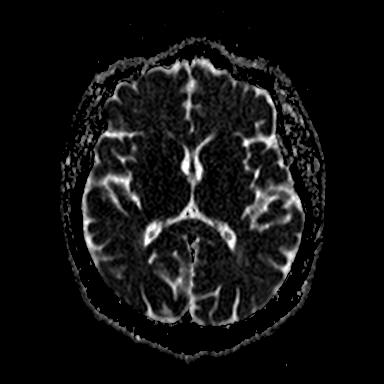
[im 55/55]
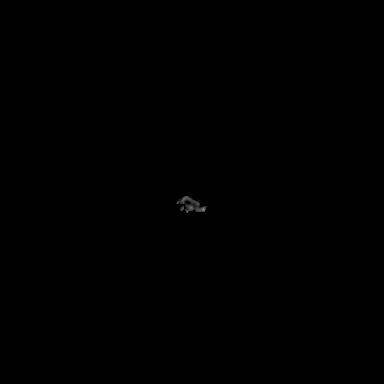

[Series 7: cor dwi_tracew · coronal · 5.0mm · 0.60mm/px · 2 of 45 slices shown (1 of 2)]
[im 1/45]
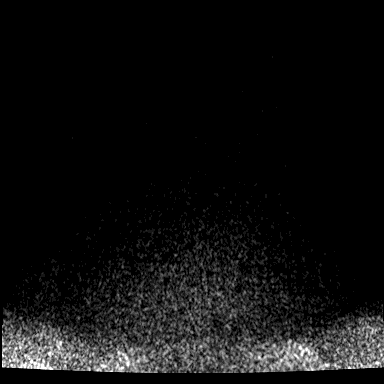
[im 45/45]
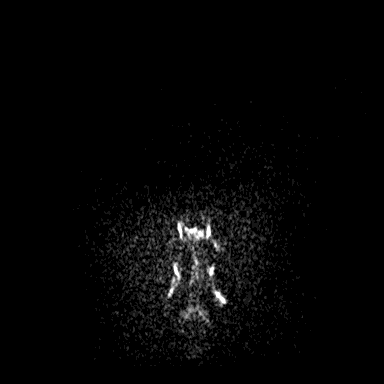

[Series 7: cor dwi_tracew · coronal · 5.0mm · 0.60mm/px · 2 of 45 slices shown (2 of 2)]
[im 1/45]
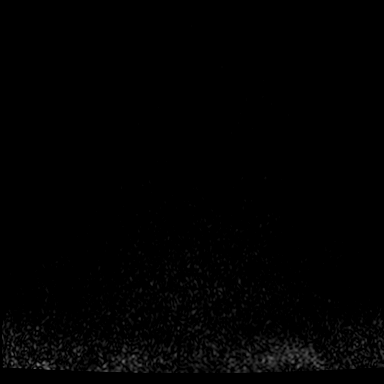
[im 45/45]
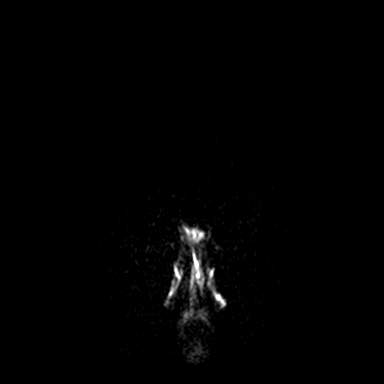

[Series 8: cor dwi_adc · coronal · 5.0mm · 0.60mm/px · 2 of 45 slices shown]
[im 1/45]
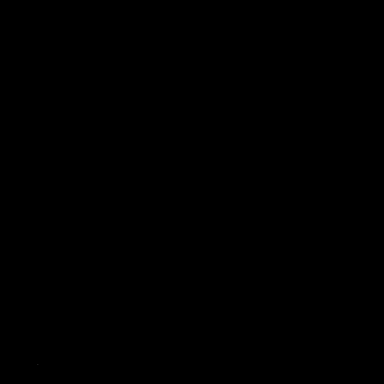
[im 45/45]
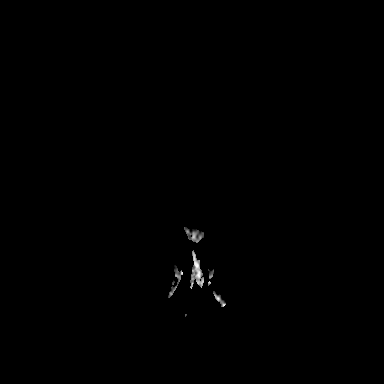

[Series 9: T1 · sagittal · 5.0mm · 0.62mm/px · 1 of 25 slices shown (1 of 2)]
[im 1/25]
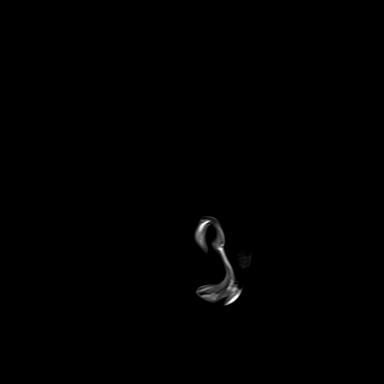

[Series 10: T2 · axial · 5.0mm · 0.53mm/px · z∈[-60,+108]mm · 2 of 29 slices shown]
[im 1/29]
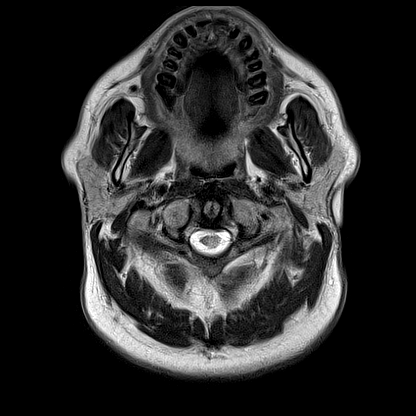
[im 29/29]
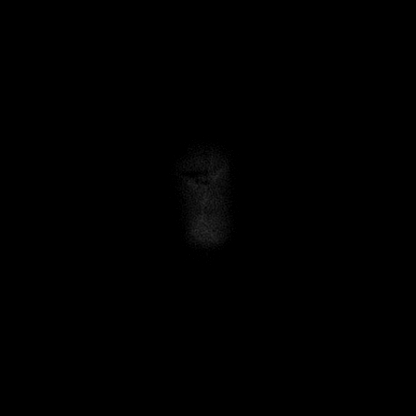

[Series 12: pha_images · axial · 3.0mm · 0.90mm/px · z∈[-61,+110]mm · 3 of 58 slices shown]
[im 1/58]
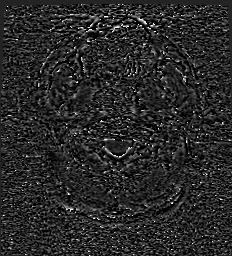
[im 29/58]
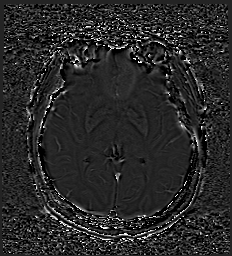
[im 58/58]
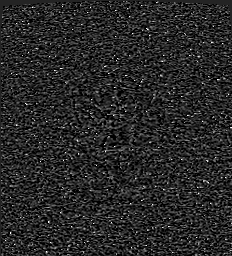

[Series 13: swi_images · axial · 3.0mm · 0.90mm/px · z∈[-64,+113]mm · 3 of 60 slices shown]
[im 1/60]
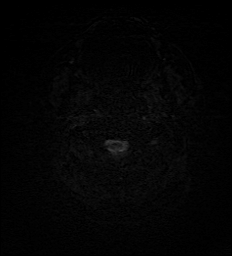
[im 30/60]
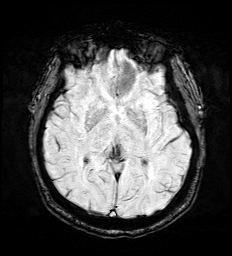
[im 60/60]
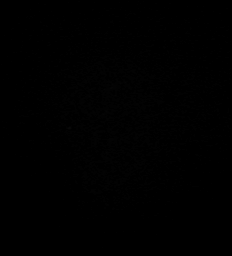

[Series 15: FLAIR · axial · 3.0mm · 0.53mm/px · z∈[-57,+105]mm · 3 of 55 slices shown]
[im 1/55]
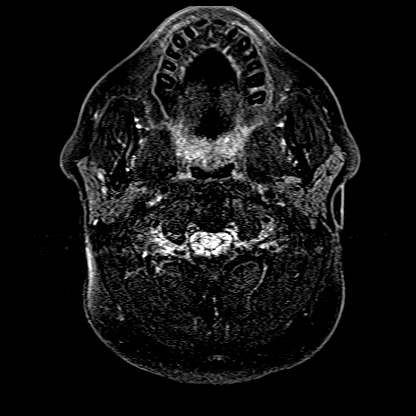
[im 28/55]
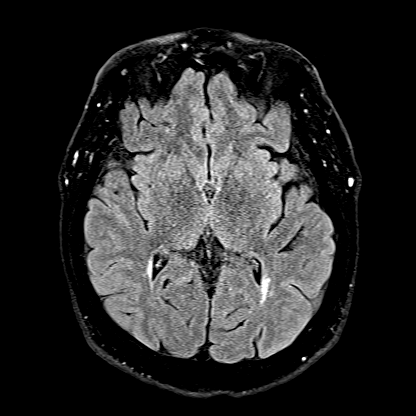
[im 55/55]
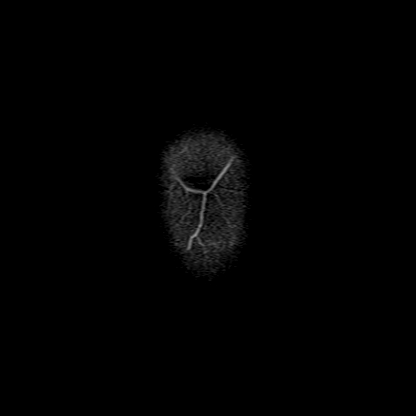

[Series 16: T1 · axial · 1.0mm · 0.98mm/px · z∈[-59,+116]mm · 8 of 174 slices shown (2 of 2)]
[im 1/174]
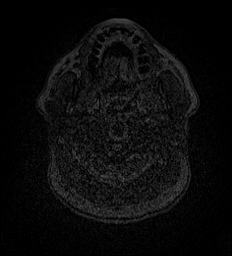
[im 22/174]
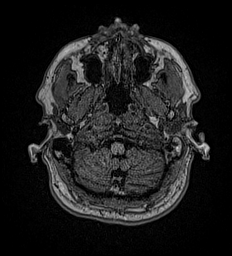
[im 44/174]
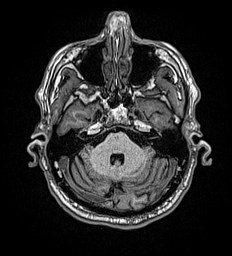
[im 65/174]
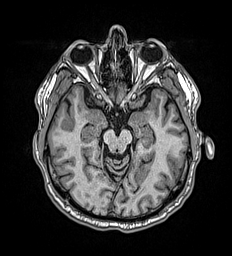
[im 109/174]
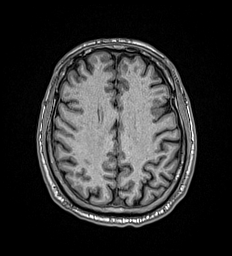
[im 130/174]
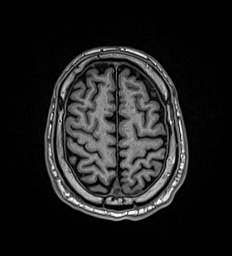
[im 152/174]
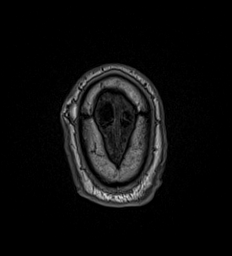
[im 174/174]
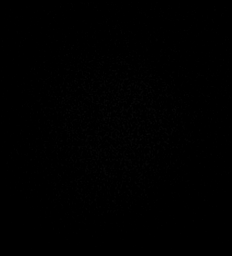

[Series 17: T2 post-contrast · coronal · 5.0mm · 0.57mm/px · 2 of 29 slices shown]
[im 1/29]
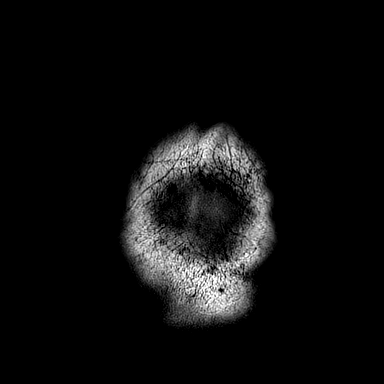
[im 29/29]
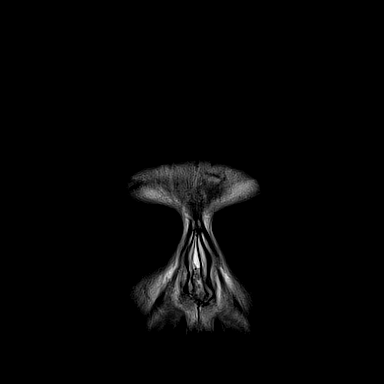

[Series 18: T1 post-contrast · axial · 1.0mm · 0.98mm/px · z∈[-66,+109]mm · 9 of 176 slices shown (1 of 2)]
[im 1/176]
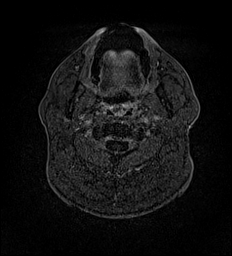
[im 20/176]
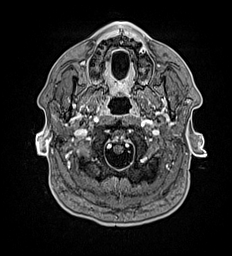
[im 39/176]
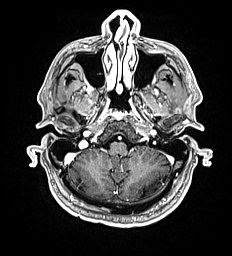
[im 59/176]
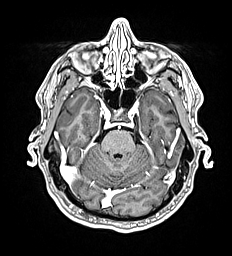
[im 78/176]
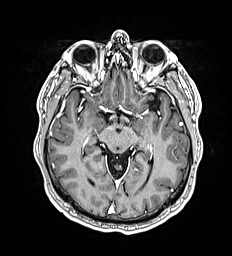
[im 98/176]
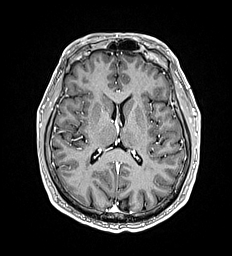
[im 117/176]
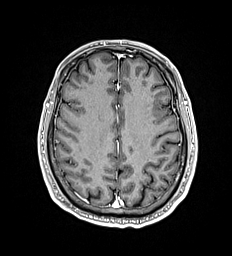
[im 156/176]
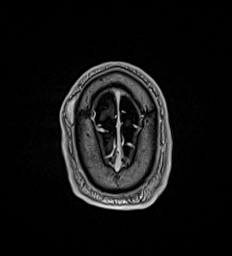
[im 176/176]
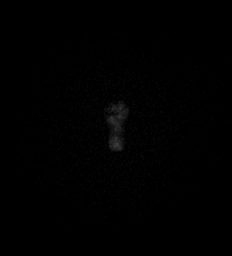

[Series 19: T1 post-contrast · coronal · 5.0mm · 0.57mm/px · 2 of 29 slices shown (2 of 2)]
[im 1/29]
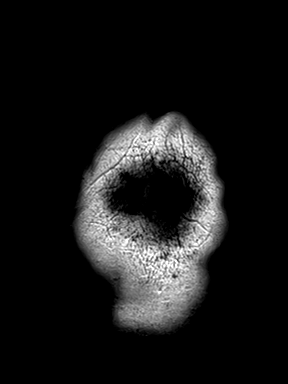
[im 29/29]
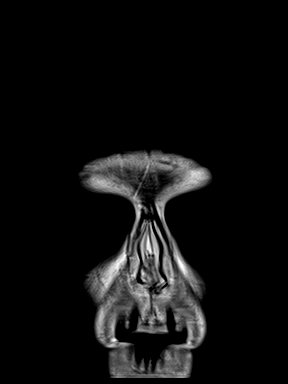

[46 of 48 positions shown; findings below may reference images not displayed]

FINDINGS: Brain: No evidence for acute infarction, hemorrhage, mass lesion,
hydrocephalus, or extra-axial fluid. Normal for age cerebral volume.
Mild subcortical and periventricular foci of T2 and FLAIR
hyperintensity in the white matter, likely chronic microvascular
ischemic change.

Vascular: Flow voids are maintained in the carotid, basilar, and
both vertebral arteries. Prominent fetal PCA on the RIGHT.

Skull and upper cervical spine: Prior cervical fusion. Visualized
skull base and upper cervical region unremarkable.

Sinuses/Orbits: No acute findings.

Other: None.
IMPRESSION: No acute intracranial abnormality. No abnormal postcontrast
enhancement.

Normal for age cerebral volume with mild white matter disease,
likely chronic microvascular ischemic change.

## 2021-12-31 ENCOUNTER — Ambulatory Visit: Payer: Medicare Other | Admitting: Neurology

## 2022-01-19 ENCOUNTER — Encounter: Payer: Self-pay | Admitting: Internal Medicine

## 2022-01-20 ENCOUNTER — Encounter: Payer: Self-pay | Admitting: Internal Medicine

## 2022-01-20 ENCOUNTER — Ambulatory Visit: Payer: Medicare Other | Admitting: Anesthesiology

## 2022-01-20 ENCOUNTER — Encounter
Admission: RE | Disposition: A | Discharge status: Home / Self Care | Payer: Self-pay | Source: Home / Self Care | Attending: Internal Medicine

## 2022-01-20 ENCOUNTER — Ambulatory Visit
Admission: RE | Admit: 2022-01-20 | Discharge: 2022-01-20 | Disposition: A | Discharge status: Home / Self Care | Payer: Medicare Other | Attending: Internal Medicine | Admitting: Internal Medicine

## 2022-01-20 DIAGNOSIS — R1030 Lower abdominal pain, unspecified: Secondary | ICD-10-CM | POA: Insufficient documentation

## 2022-01-20 DIAGNOSIS — D128 Benign neoplasm of rectum: Secondary | ICD-10-CM | POA: Diagnosis not present

## 2022-01-20 DIAGNOSIS — Z87891 Personal history of nicotine dependence: Secondary | ICD-10-CM | POA: Insufficient documentation

## 2022-01-20 DIAGNOSIS — I10 Essential (primary) hypertension: Secondary | ICD-10-CM | POA: Insufficient documentation

## 2022-01-20 DIAGNOSIS — R569 Unspecified convulsions: Secondary | ICD-10-CM | POA: Insufficient documentation

## 2022-01-20 DIAGNOSIS — K640 First degree hemorrhoids: Secondary | ICD-10-CM | POA: Diagnosis not present

## 2022-01-20 DIAGNOSIS — Z79891 Long term (current) use of opiate analgesic: Secondary | ICD-10-CM | POA: Insufficient documentation

## 2022-01-20 DIAGNOSIS — Z86010 Personal history of colonic polyps: Secondary | ICD-10-CM | POA: Diagnosis not present

## 2022-01-20 DIAGNOSIS — T402X5A Adverse effect of other opioids, initial encounter: Secondary | ICD-10-CM | POA: Insufficient documentation

## 2022-01-20 DIAGNOSIS — K5903 Drug induced constipation: Secondary | ICD-10-CM | POA: Diagnosis not present

## 2022-01-20 HISTORY — DX: Essential (primary) hypertension: I10

## 2022-01-20 HISTORY — PX: COLONOSCOPY WITH PROPOFOL: SHX5780

## 2022-01-20 HISTORY — DX: Inflammatory liver disease, unspecified: K75.9

## 2022-01-20 HISTORY — DX: Unspecified convulsions: R56.9

## 2022-01-20 HISTORY — DX: Anxiety disorder, unspecified: F41.9

## 2022-01-20 SURGERY — COLONOSCOPY WITH PROPOFOL
Anesthesia: General

## 2022-01-20 MED ORDER — LIDOCAINE HCL (CARDIAC) PF 100 MG/5ML IV SOSY
PREFILLED_SYRINGE | INTRAVENOUS | Status: DC | PRN
  Administered 2022-01-20: 40 mg via INTRAVENOUS

## 2022-01-20 MED ORDER — PROPOFOL 500 MG/50ML IV EMUL
INTRAVENOUS | Status: DC | PRN
Start: 2022-01-20 — End: 2022-01-20
  Administered 2022-01-20: 125 ug/kg/min via INTRAVENOUS

## 2022-01-20 MED ORDER — PROPOFOL 500 MG/50ML IV EMUL
INTRAVENOUS | Status: AC
  Filled 2022-01-20: qty 50

## 2022-01-20 MED ORDER — PROPOFOL 10 MG/ML IV BOLUS
INTRAVENOUS | Status: DC | PRN
  Administered 2022-01-20: 50 mg via INTRAVENOUS

## 2022-01-20 MED ORDER — SODIUM CHLORIDE 0.9 % IV SOLN
INTRAVENOUS | Status: DC
  Administered 2022-01-20: 1000 mL via INTRAVENOUS

## 2022-01-20 MED ORDER — MIDAZOLAM HCL 2 MG/2ML IJ SOLN
INTRAMUSCULAR | Status: AC
  Filled 2022-01-20: qty 2

## 2022-01-20 MED ORDER — MIDAZOLAM HCL 2 MG/2ML IJ SOLN
INTRAMUSCULAR | Status: DC | PRN
  Administered 2022-01-20: 2 mg via INTRAVENOUS

## 2022-01-20 NOTE — Interval H&P Note (Signed)
History and Physical Interval Note: ? ?01/20/2022 ?3:28 PM ? ?Benjamin Daniels  has presented today for surgery, with the diagnosis of Chronic Constipation.  The various methods of treatment have been discussed with the patient and family. After consideration of risks, benefits and other options for treatment, the patient has consented to  Procedure(s): ?COLONOSCOPY WITH PROPOFOL (N/A) as a surgical intervention.  The patient's history has been reviewed, patient examined, no change in status, stable for surgery.  I have reviewed the patient's chart and labs.  Questions were answered to the patient's satisfaction.   ? ? ?[place], [place] ? ? ?

## 2022-01-20 NOTE — Op Note (Addendum)
[facility] ?Gastroenterology ?Patient Name: Benjamin Daniels ?Procedure Date: 01/20/2022 3:40 PM ?MRN: [id number] ?Account #: [id number] ?Date of Birth: 1961/02/28 ?Admit Type: Outpatient ?Age: 61 ?Room: [facility] ENDO ROOM 2 ?Gender: Male ?Note Status: Supervisor Override ?Instrument Name: Colonscope [id number] ?Procedure:             Colonoscopy ?Indications:           Personal history of colonic polyps, Lower abdominal  ?                       pain, Constipation ?Providers:             Benay Pike. Alice Reichert MD, MD ?Referring MD:          No Local Md, MD (Referring MD) ?Medicines:             Propofol per Anesthesia ?Complications:         No immediate complications. ?Procedure:             Pre-Anesthesia Assessment: ?                       - The risks and benefits of the procedure and the  ?                       sedation options and risks were discussed with the  ?                       patient. All questions were answered and informed  ?                       consent was obtained. ?                       - Patient identification and proposed procedure were  ?                       verified prior to the procedure by the nurse. The  ?                       procedure was verified in the procedure room. ?                       - After reviewing the risks and benefits, the patient  ?                       was deemed in satisfactory condition to undergo the  ?                       procedure. ?                       - ASA Grade Assessment: III - A patient with severe  ?                       systemic disease. ?                       - After reviewing the risks and benefits, the patient  ?  was deemed in satisfactory condition to undergo the  ?                       procedure. ?                       After obtaining informed consent, the colonoscope was  ?                       passed under direct vision. Throughout the procedure,  ?                       the patient's blood pressure, pulse,  and oxygen  ?                       saturations were monitored continuously. The  ?                       Colonoscope was introduced through the anus and  ?                       advanced to the the cecum, identified by appendiceal  ?                       orifice and ileocecal valve. The colonoscopy was  ?                       performed without difficulty. The patient tolerated  ?                       the procedure well. The quality of the bowel  ?                       preparation was good. The ileocecal valve, appendiceal  ?                       orifice, and rectum were photographed. ?Findings: ?     The perianal and digital rectal examinations were normal. Pertinent  ?     negatives include normal sphincter tone and no palpable rectal lesions. ?     Non-bleeding internal hemorrhoids were found during retroflexion. The  ?     hemorrhoids were Grade I (internal hemorrhoids that do not prolapse). ?     A 5 mm polyp was found in the rectum. The polyp was sessile. The polyp  ?     was removed with a jumbo cold forceps. Resection and retrieval were  ?     complete. ?     The exam was otherwise without abnormality. ?Impression:            - Non-bleeding internal hemorrhoids. ?                       - One 5 mm polyp in the rectum, removed with a jumbo  ?                       cold forceps. Resected and retrieved. ?                       - The examination was otherwise normal. ?Recommendation:        -  Patient has a contact number available for  ?                       emergencies. The signs and symptoms of potential  ?                       delayed complications were discussed with the patient.  ?                       Return to normal activities tomorrow. Written  ?                       discharge instructions were provided to the patient. ?                       - Resume previous diet. ?                       - Continue present medications. ?                       - Repeat colonoscopy is recommended for  surveillance.  ?                       The colonoscopy date will be determined after  ?                       pathology results from today's exam become available  ?                       for review. ?                       - Return to GI office as previously scheduled. ?                       - Follow up with Dawson Bills, NP at [facility]  ?                       Gastroenterology. ?                       - The findings and recommendations were discussed with  ?                       the patient. ?Procedure Code(s):     --- Professional --- ?                       [phone], Colonoscopy, flexible; with biopsy, single or  ?                       multiple ?Diagnosis Code(s):     --- Professional --- ?                       K64.0, First degree hemorrhoids ?                       K62.1, Rectal polyp ?                       Z86.010, Personal  history of colonic polyps ?CPT copyright 2019 [employer] Medical [employer]. All rights reserved. ?The codes documented in this report are preliminary and upon coder review may  ?be revised to meet current compliance requirements. ?Efrain Sella MD, MD ?01/20/2022 3:59:41 PM ?This report has been signed electronically. ?Number of Addenda: 0 ?Note Initiated On: 01/20/2022 3:40 PM ?Scope Withdrawal Time: 0 hours 6 minutes 8 seconds  ?Total Procedure Duration: 0 hours 11 minutes 32 seconds  ?Estimated Blood Loss:  Estimated blood loss: none. ?     [facility] ?

## 2022-01-20 NOTE — Anesthesia Preprocedure Evaluation (Signed)
Anesthesia Evaluation  ?Patient identified by MRN, date of birth, ID band ?Patient awake ? ? ? ?Reviewed: ?Allergy & Precautions, NPO status , Patient's Chart, lab work & pertinent test results ? ?History of Anesthesia Complications ?Negative for: history of anesthetic complications ? ?Airway ?Mallampati: III ? ?TM Distance: >3 FB ?Neck ROM: full ? ? ? Dental ? ?(+) Chipped, Poor Dentition, Missing ?  ?Pulmonary ?neg shortness of breath, COPD, Current Smoker and Patient abstained from smoking.,  ?  ?Pulmonary exam normal ? ? ? ? ? ? ? Cardiovascular ?hypertension, (-) angina(-) Past MI Normal cardiovascular exam ? ? ?  ?Neuro/Psych ?Seizures -, Poorly Controlled,  PSYCHIATRIC DISORDERS negative psych ROS  ? GI/Hepatic ?negative GI ROS, (+) Hepatitis -  ?Endo/Other  ?negative endocrine ROS ? Renal/GU ?negative Renal ROS  ?negative genitourinary ?  ?Musculoskeletal ? ? Abdominal ?  ?Peds ? Hematology ?negative hematology ROS ?(+)   ?Anesthesia Other Findings ?Past Medical History: ?No date: Anxiety ?No date: Hepatitis ?    Comment:  C ?No date: Hypertension ?No date: Seizure [facility]) ? ?Past Surgical History: ?No date: ANKLE SURGERY ?No date: BACK SURGERY ?No date: HERNIA REPAIR ?No date: KNEE SURGERY ?No date: SHOULDER ARTHROSCOPY ? ?BMI   ? Body Mass Index: 24.67 kg/m?  ?  ? ? Reproductive/Obstetrics ?negative OB ROS ? ?  ? ? ? ? ? ? ? ? ? ? ? ? ? ?  ?  ? ? ? ? ? ? ? ? ?Anesthesia Physical ?Anesthesia Plan ? ?ASA: 3 ? ?Anesthesia Plan: General  ? ?Post-op Pain Management:   ? ?Induction: Intravenous ? ?PONV Risk Score and Plan: Propofol infusion and TIVA ? ?Airway Management Planned: Natural Airway and Nasal Cannula ? ?Additional Equipment:  ? ?Intra-op Plan:  ? ?Post-operative Plan:  ? ?Informed Consent: I have reviewed the patients History and Physical, chart, labs and discussed the procedure including the risks, benefits and alternatives for the proposed anesthesia with the patient  or authorized representative who has indicated his/her understanding and acceptance.  ? ? ? ?Dental Advisory Given ? ?Plan Discussed with: Anesthesiologist, CRNA and Surgeon ? ?Anesthesia Plan Comments: (Patient consented for risks of anesthesia including but not limited to:  ?- adverse reactions to medications ?- risk of airway placement if required ?- damage to eyes, teeth, lips or other oral mucosa ?- nerve damage due to positioning  ?- sore throat or hoarseness ?- Damage to heart, brain, nerves, lungs, other parts of body or loss of life ? ?Patient voiced understanding.)  ? ? ? ? ? ? ?Anesthesia Quick Evaluation ? ?

## 2022-01-20 NOTE — H&P (Signed)
?Outpatient short stay form Pre-procedure ?01/20/2022 3:26 PM ?Benjamin Daniels K. Alice Reichert, M.D. ? ?Primary Physician: [name], PA-C ? ?Reason for visit:  abdominal pain, change in bowel habits, hx of rectal polyp ? ?History of present illness:  Patient with hx chronic pain on opioid analgesics has c/o lower abdominal pain, opioid constipation presents with lower abdominal pain/cramping somewhat relieved with having a BM. ?This patient presents today for chronic lower abdominal pain, cramping, associated with opioid induced constipation. This been present for years. He does find that Linzess and lactulose work to relive the patient.  ? ?Had a rectal polyp in 2018 (not removed) and inadequate prep on colonoscopy which seemed abbreviated.  ? ?Current Facility-Administered Medications:  ?  0.9 %  sodium chloride infusion, , Intravenous, Continuous, [place], Benay Pike, MD, Last Rate: 20 mL/hr at 01/20/22 1525, Continued from Pre-op at 01/20/22 1525 ? ?Medications Prior to Admission  ?Medication Sig Dispense Refill Last Dose  ? Cholecalciferol (VITAMIN D3) 10 MCG (400 UNIT) CAPS Take 800 Units by mouth.   Past Week  ? cyclobenzaprine (FLEXERIL) 5 MG tablet Take 5 mg by mouth at bedtime as needed for muscle spasms.   Past Week  ? hydrochlorothiazide (HYDRODIURIL) 25 MG tablet Take 25 mg by mouth daily.   01/19/2022 at 0740 pm  ? ibuprofen (ADVIL) 800 MG tablet Take 800 mg by mouth every 8 (eight) hours as needed.   Past Week  ? lactulose (CHRONULAC) 10 GM/15ML solution Take 20 g by mouth 2 (two) times daily.   Past Week  ? linaclotide (LINZESS) 290 MCG CAPS capsule Take 290 mcg by mouth daily before breakfast.   Past Week  ? Magnesium 300 MG CAPS Take 600 mg by mouth daily at 8 pm.   Past Week  ? Melatonin 10 MG CAPS Take by mouth at bedtime.   01/19/2022 at 0745pm  ? Melatonin 5 MG CAPS Take by mouth at bedtime as needed.     ? metoprolol succinate (TOPROL-XL) 50 MG 24 hr tablet Take 50 mg by mouth daily. Take with or immediately  following a meal.   01/19/2022  ? naloxone (NARCAN) nasal spray 4 mg/0.1 mL Place 1 spray into the nose.     ? Omega-3 Fatty Acids (FISH OIL) 1200 MG CAPS Take by mouth.   Past Week  ? oxycodone (OXY-IR) 5 MG capsule Take 5 mg by mouth 3 (three) times daily as needed.   01/20/2022 at 0815  ? vitamin B-12 (CYANOCOBALAMIN) 1000 MCG tablet Take 1,000 mcg by mouth daily.   Past Week at 0815  ? vitamin C (ASCORBIC ACID) 500 MG tablet Take 500 mg by mouth daily.   Past Week  ? ? ? ?No Known Allergies ? ? ?Past Medical History:  ?Diagnosis Date  ? Anxiety   ? Hepatitis   ? C  ? Hypertension   ? Seizure ([place])   ? ? ?Review of systems:  Otherwise negative.  ? ? ?Physical Exam ? ?Gen: Alert, oriented. Appears stated age.  ?HEENT: [place]/AT. PERRLA. ?Lungs: CTA, no wheezes. ?CV: RR nl S1, S2. ?Abd: soft, benign, no masses. BS+ ?Ext: No edema. Pulses 2+ ? ? ? ?Planned procedures: Proceed with colonoscopy. The patient understands the nature of the planned procedure, indications, risks, alternatives and potential complications including but not limited to bleeding, infection, perforation, damage to internal organs and possible oversedation/side effects from anesthesia. The patient agrees and gives consent to proceed.  ?Please refer to procedure notes for findings, recommendations and patient disposition/instructions.  ? ? ? ?  [name] K. Alice Reichert, M.D. ?Gastroenterology ?01/20/2022  3:26 PM ? ? ? ? ? ?

## 2022-01-20 NOTE — Transfer of Care (Signed)
Immediate Anesthesia Transfer of Care Note ? ?Patient: Benjamin Daniels ? ?Procedure(s) Performed: COLONOSCOPY WITH PROPOFOL ? ?Patient Location: PACU ? ?Anesthesia Type:General ? ?Level of Consciousness: awake and drowsy ? ?Airway & Oxygen Therapy: Patient Spontanous Breathing ? ?Post-op Assessment: Report given to RN and Post -op Vital signs reviewed and stable ? ?Post vital signs: Reviewed and stable ? ?Last Vitals:  ?Vitals Value Taken Time  ?BP    ?Temp    ?Pulse    ?Resp    ?SpO2    ? ? ?Last Pain:  ?Vitals:  ? 01/20/22 1428  ?TempSrc: Temporal  ?PainSc: 10-Worst pain ever  ?   ? ?Patients Stated Pain Goal: 0 (01/20/22 1428) ? ?Complications: No notable events documented. ?

## 2022-01-22 ENCOUNTER — Encounter: Payer: Self-pay | Admitting: Internal Medicine

## 2022-01-22 LAB — SURGICAL PATHOLOGY

## 2022-01-27 NOTE — Anesthesia Postprocedure Evaluation (Signed)
Anesthesia Post Note ? ?Patient: Benjamin Daniels ? ?Procedure(s) Performed: COLONOSCOPY WITH PROPOFOL ? ?Patient location during evaluation: PACU ?Anesthesia Type: General ?Level of consciousness: awake and alert ?Pain management: pain level controlled ?Vital Signs Assessment: post-procedure vital signs reviewed and stable ?Respiratory status: spontaneous breathing, nonlabored ventilation, respiratory function stable and patient connected to nasal cannula oxygen ?Cardiovascular status: blood pressure returned to baseline and stable ?Postop Assessment: no apparent nausea or vomiting ?Anesthetic complications: no ? ? ?No notable events documented. ? ? ?Last Vitals:  ?Vitals:  ? 01/20/22 1608 01/20/22 1618  ?BP: 112/72 134/81  ?Pulse: 65 64  ?Resp: 15 13  ?Temp:    ?SpO2: 100% 99%  ?  ?Last Pain:  ?Vitals:  ? 01/20/22 1618  ?TempSrc:   ?PainSc: 0-No pain  ? ? ?  ?  ?  ?  ?  ?  ? ?Molli Barrows ? ? ? ? ?

## 2022-02-05 ENCOUNTER — Ambulatory Visit: Payer: Medicare Other | Admitting: Neurology
